# Patient Record
Sex: Female | Born: 1970 | Race: White | Hispanic: No | Marital: Married
Health system: Southern US, Community
[De-identification: ages and names within clinical notes are randomized; demographics above are authoritative.]

## PROBLEM LIST (undated history)

## (undated) HISTORY — PX: BREAST BIOPSY: SHX20

---

## 1998-02-19 ENCOUNTER — Ambulatory Visit (HOSPITAL_BASED_OUTPATIENT_CLINIC_OR_DEPARTMENT_OTHER): Admission: RE | Admit: 1998-02-19 | Discharge: 1998-02-19 | Payer: Self-pay | Admitting: Surgery

## 1999-04-07 ENCOUNTER — Encounter: Payer: Self-pay | Admitting: Obstetrics and Gynecology

## 1999-04-07 ENCOUNTER — Ambulatory Visit (HOSPITAL_COMMUNITY): Admission: RE | Admit: 1999-04-07 | Discharge: 1999-04-07 | Payer: Self-pay | Admitting: Obstetrics and Gynecology

## 1999-05-25 ENCOUNTER — Encounter: Payer: Self-pay | Admitting: Obstetrics and Gynecology

## 1999-05-25 ENCOUNTER — Ambulatory Visit (HOSPITAL_COMMUNITY): Admission: RE | Admit: 1999-05-25 | Discharge: 1999-05-25 | Payer: Self-pay | Admitting: Obstetrics and Gynecology

## 1999-06-27 ENCOUNTER — Inpatient Hospital Stay (HOSPITAL_COMMUNITY): Admission: AD | Admit: 1999-06-27 | Discharge: 1999-06-27 | Payer: Self-pay | Admitting: Obstetrics and Gynecology

## 1999-08-03 ENCOUNTER — Inpatient Hospital Stay (HOSPITAL_COMMUNITY): Admission: AD | Admit: 1999-08-03 | Discharge: 1999-08-05 | Payer: Self-pay | Admitting: Obstetrics and Gynecology

## 1999-08-06 ENCOUNTER — Encounter: Admission: RE | Admit: 1999-08-06 | Discharge: 1999-11-04 | Payer: Self-pay | Admitting: Obstetrics and Gynecology

## 1999-11-06 ENCOUNTER — Encounter: Admission: RE | Admit: 1999-11-06 | Discharge: 2000-02-04 | Payer: Self-pay | Admitting: Obstetrics and Gynecology

## 2001-03-06 ENCOUNTER — Other Ambulatory Visit: Admission: RE | Admit: 2001-03-06 | Discharge: 2001-03-06 | Payer: Self-pay | Admitting: Obstetrics and Gynecology

## 2001-09-30 ENCOUNTER — Ambulatory Visit (HOSPITAL_COMMUNITY): Admission: RE | Admit: 2001-09-30 | Discharge: 2001-09-30 | Payer: Self-pay | Admitting: Obstetrics and Gynecology

## 2001-09-30 ENCOUNTER — Encounter: Payer: Self-pay | Admitting: Obstetrics and Gynecology

## 2001-10-28 ENCOUNTER — Ambulatory Visit (HOSPITAL_COMMUNITY): Admission: RE | Admit: 2001-10-28 | Discharge: 2001-10-28 | Payer: Self-pay | Admitting: Obstetrics and Gynecology

## 2001-10-28 ENCOUNTER — Encounter: Payer: Self-pay | Admitting: Obstetrics and Gynecology

## 2001-11-11 ENCOUNTER — Inpatient Hospital Stay (HOSPITAL_COMMUNITY): Admission: AD | Admit: 2001-11-11 | Discharge: 2001-11-11 | Payer: Self-pay | Admitting: Obstetrics and Gynecology

## 2002-02-26 ENCOUNTER — Inpatient Hospital Stay (HOSPITAL_COMMUNITY): Admission: AD | Admit: 2002-02-26 | Discharge: 2002-02-28 | Payer: Self-pay | Admitting: Obstetrics and Gynecology

## 2002-03-01 ENCOUNTER — Encounter (HOSPITAL_COMMUNITY): Admission: RE | Admit: 2002-03-01 | Discharge: 2002-03-31 | Payer: Self-pay | Admitting: Obstetrics and Gynecology

## 2002-04-01 ENCOUNTER — Encounter: Admission: RE | Admit: 2002-04-01 | Discharge: 2002-05-01 | Payer: Self-pay | Admitting: Obstetrics and Gynecology

## 2002-04-14 ENCOUNTER — Other Ambulatory Visit: Admission: RE | Admit: 2002-04-14 | Discharge: 2002-04-14 | Payer: Self-pay | Admitting: Obstetrics and Gynecology

## 2002-05-02 ENCOUNTER — Encounter: Admission: RE | Admit: 2002-05-02 | Discharge: 2002-06-01 | Payer: Self-pay | Admitting: Obstetrics and Gynecology

## 2003-08-05 ENCOUNTER — Other Ambulatory Visit: Admission: RE | Admit: 2003-08-05 | Discharge: 2003-08-05 | Payer: Self-pay | Admitting: Obstetrics and Gynecology

## 2003-11-04 ENCOUNTER — Emergency Department (HOSPITAL_COMMUNITY): Admission: EM | Admit: 2003-11-04 | Discharge: 2003-11-04 | Payer: Self-pay | Admitting: Emergency Medicine

## 2004-09-22 ENCOUNTER — Other Ambulatory Visit: Admission: RE | Admit: 2004-09-22 | Discharge: 2004-09-22 | Payer: Self-pay | Admitting: Obstetrics and Gynecology

## 2005-02-19 IMAGING — CT CT HEAD W/O CM
1 of 2 series · 13 of 30 positions shown, 17 images · non-contrast
Comparison: none

CLINICAL DATA: 32-year-old fell.  Closed head injury.
NON-CONTRAST CRANIAL CT
Intracranially, the ventricles are in the midline without mass effect or shift.  No extraaxial fluid collections are seen.  No CT evidence for acute intracranial abnormality and no intracranial mass lesions.  There are acute calcified subcutaneous nodules located posteriorly.  The largest is posteriorly near the vertex and measures 12 mm.  Examination of the bony structures demonstrates no acute bony findings.  The visualized paranasal sinuses and mastoid air cells are clear. 
IMPRESSION
1.  No acute intracranial abnormality.
2.  No evidence for skull fracture. 
3.  Calcified subcutaneous nodules in the scalp posteriorly.  Recommend clinical correlation.

[Series 3: — · axial · 0.43mm/px · z∈[-142,-22]mm · 13 of 28 slices shown, 17 images]
[im 2/28  brain]
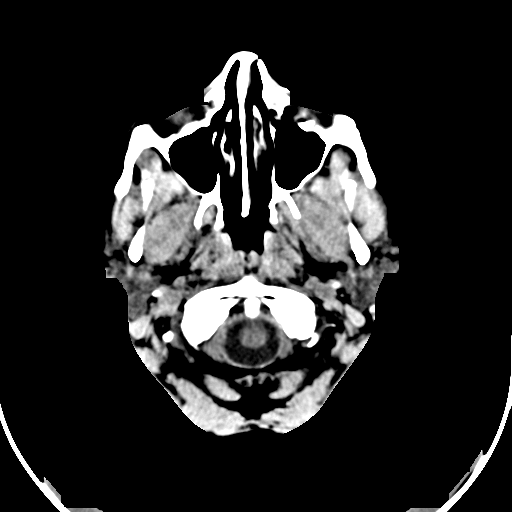
[im 2/28  bone]
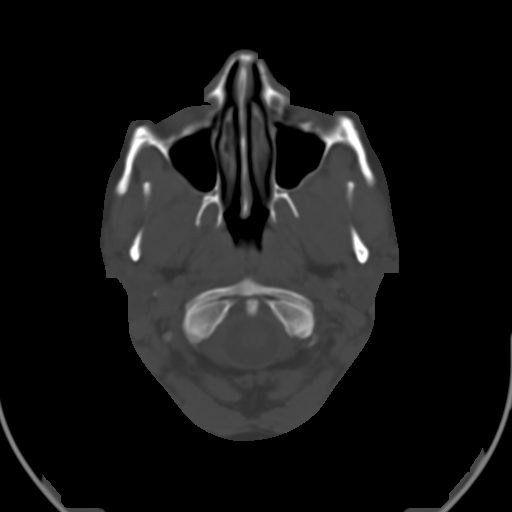
[im 4/28  brain]
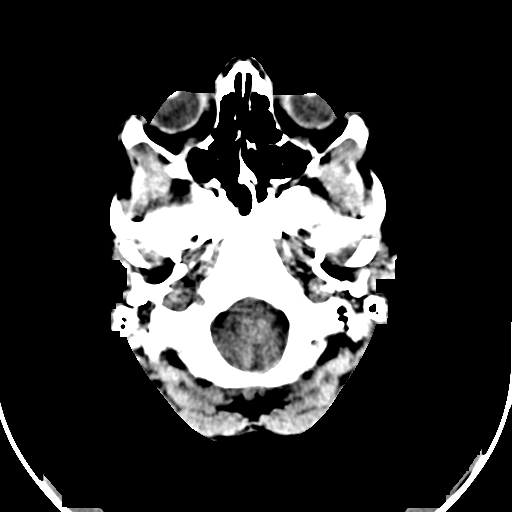
[im 6/28  brain]
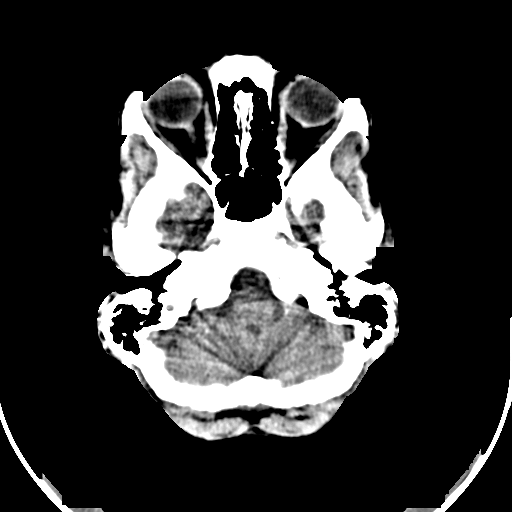
[im 8/28  brain]
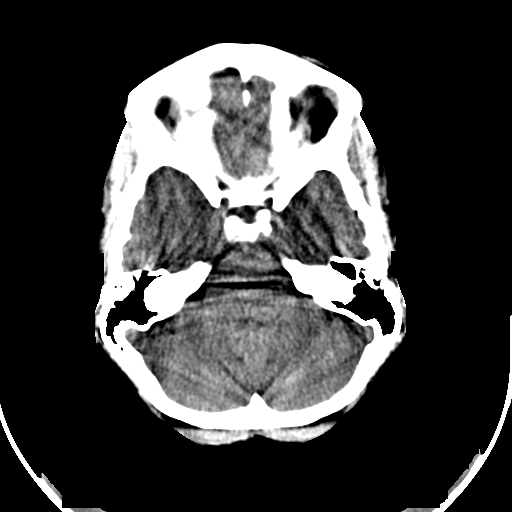
[im 10/28  brain]
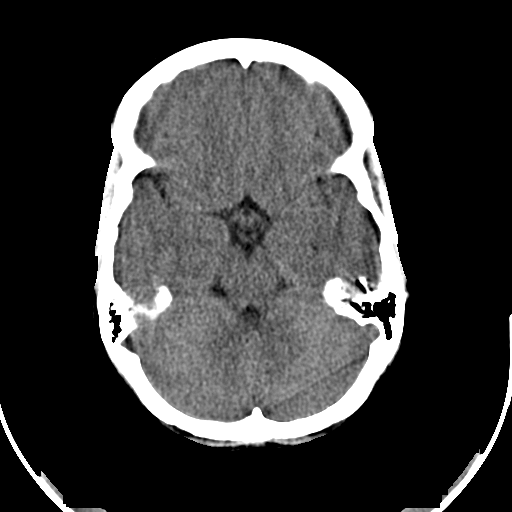
[im 10/28  bone]
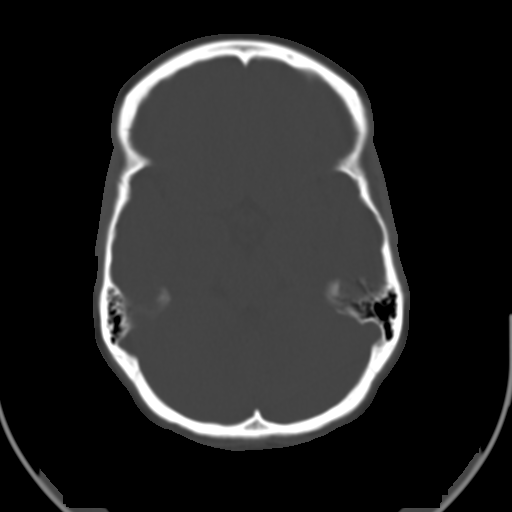
[im 12/28  brain]
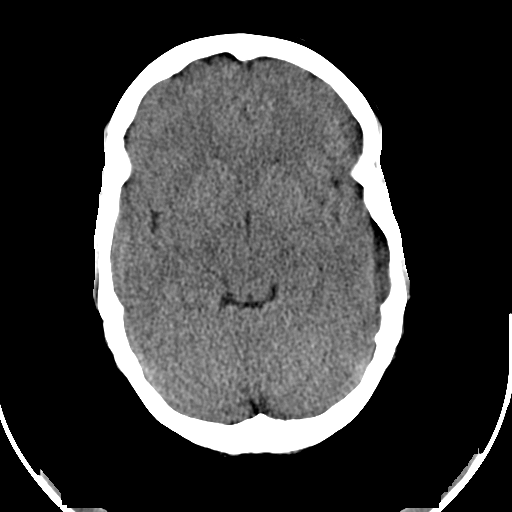
[im 14/28  brain]
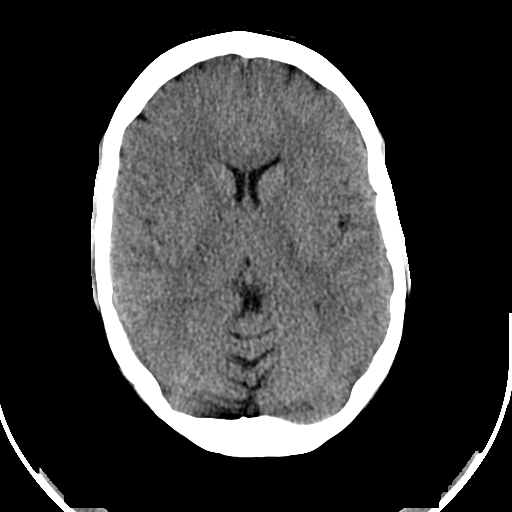
[im 16/28  brain]
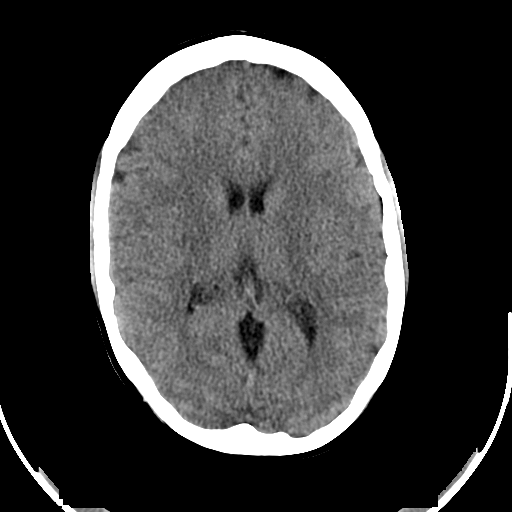
[im 18/28  brain]
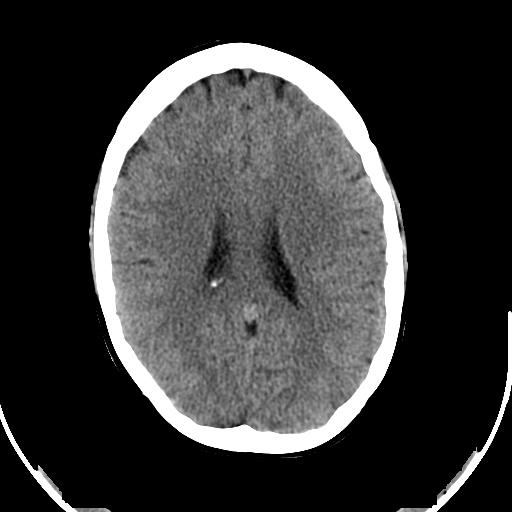
[im 18/28  bone]
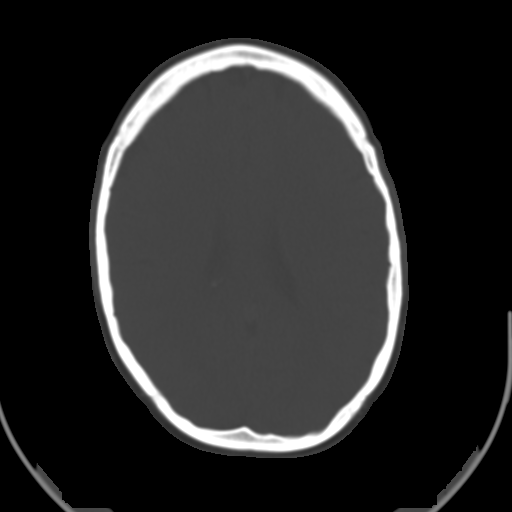
[im 20/28  brain]
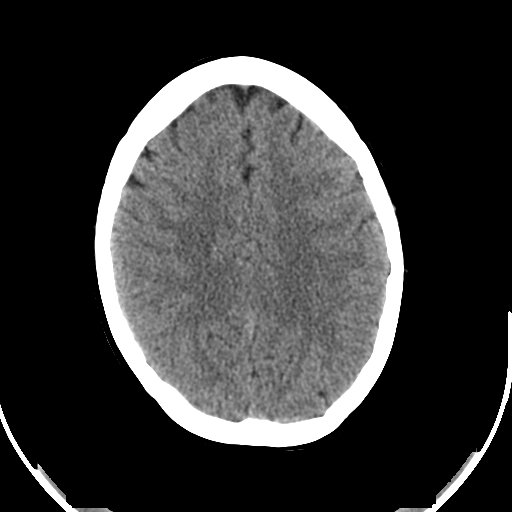
[im 22/28  brain]
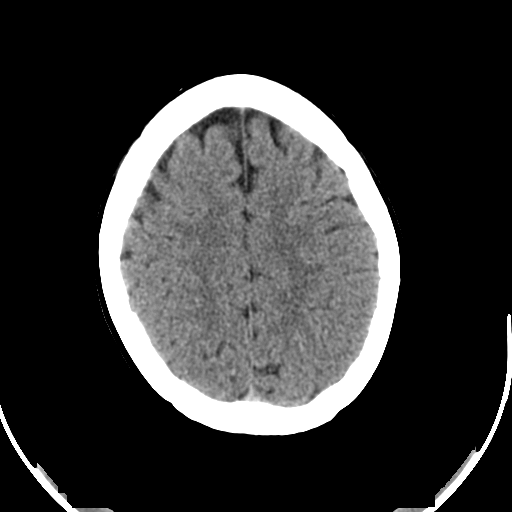
[im 24/28  brain]
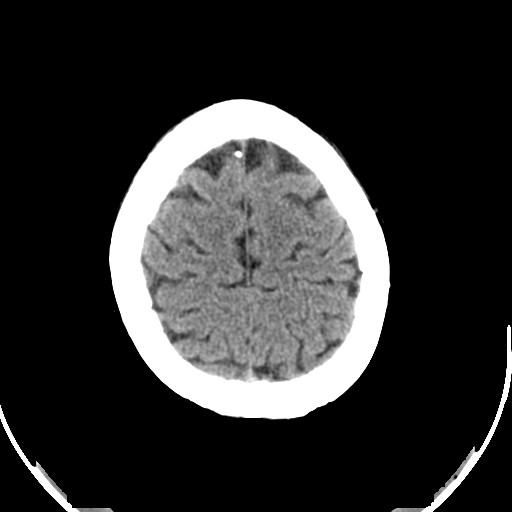
[im 26/28  brain]
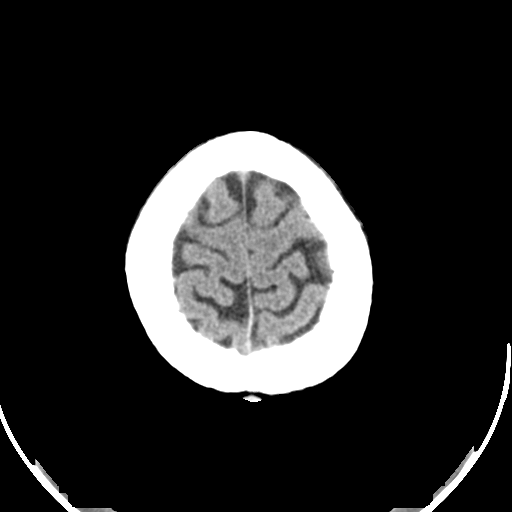
[im 26/28  bone]
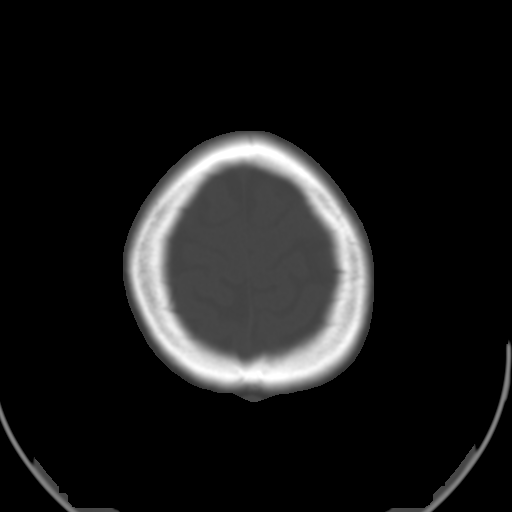

[13 of 30 positions shown; findings below may reference images not displayed]

## 2005-07-26 ENCOUNTER — Other Ambulatory Visit: Admission: RE | Admit: 2005-07-26 | Discharge: 2005-07-26 | Payer: Self-pay | Admitting: Obstetrics and Gynecology

## 2006-01-19 ENCOUNTER — Inpatient Hospital Stay (HOSPITAL_COMMUNITY): Admission: AD | Admit: 2006-01-19 | Discharge: 2006-01-21 | Payer: Self-pay | Admitting: Obstetrics and Gynecology

## 2010-11-02 ENCOUNTER — Encounter: Payer: Self-pay | Admitting: Family Medicine

## 2010-11-15 ENCOUNTER — Other Ambulatory Visit: Payer: Self-pay | Admitting: Family Medicine

## 2010-11-17 ENCOUNTER — Other Ambulatory Visit: Payer: Self-pay | Admitting: Family Medicine

## 2010-11-17 DIAGNOSIS — R928 Other abnormal and inconclusive findings on diagnostic imaging of breast: Secondary | ICD-10-CM

## 2010-11-28 ENCOUNTER — Ambulatory Visit
Admission: RE | Admit: 2010-11-28 | Discharge: 2010-11-28 | Disposition: A | Discharge status: Home / Self Care | Payer: Medicare HMO | Source: Ambulatory Visit | Attending: Family Medicine | Admitting: Family Medicine

## 2010-11-28 ENCOUNTER — Other Ambulatory Visit: Payer: Self-pay | Admitting: Family Medicine

## 2010-11-28 DIAGNOSIS — N6320 Unspecified lump in the left breast, unspecified quadrant: Secondary | ICD-10-CM

## 2010-11-28 DIAGNOSIS — R928 Other abnormal and inconclusive findings on diagnostic imaging of breast: Secondary | ICD-10-CM

## 2011-02-17 NOTE — H&P (Signed)
NAME:  Sara Acevedo, Sara Acevedo               ACCOUNT NO.:  [id number]   MEDICAL RECORD NO.:  [id number]          PATIENT TYPE:  INP   LOCATION:  9170                          FACILITY:  [facility]   PHYSICIAN:  Zenaida Niece, M.D.DATE OF BIRTH:  January 09, 1971   DATE OF ADMISSION:  01/19/2006  DATE OF DISCHARGE:                                HISTORY & PHYSICAL   CHIEF COMPLAINT:  Premature rupture of membranes.   HISTORY OF PRESENT ILLNESS:  This is a 40 year old white female, gravida 3,  para 1-1-0-2, with an EGA of 37+ weeks by a 7-week ultrasound with a due  date of May 5th, who is seen on the 20th for a routine exam. Dr. Ambrose Mantle saw  her and measured her size greater than dates. She had an NST which was  reactive and was scheduled to get an ultrasound. However, after her NST she  had spontaneous rupture of membranes. Dr. Ambrose Mantle had examined her prior to  that and her cervix was 2, 25, -3, and a vertex presentation. She is  admitted with premature rupture of membranes. Prenatal care complicated by  advanced maternal age with normal first trimester screening, an exposure to  parvovirus B19, without evidence of prior immunity. She was followed with  serial IgG and IgM levels and these never became positive. She had no  significant complications.   PRENATAL LABORATORIES:  Blood type is AB positive with negative antibody  screen, RPR nonreactive, rubella immune. Hepatitis B surface antigen  negative. Gonorrhea and Chlamydia negative. One-hour Glucola was 121 and  group B strep is negative.   OBSTETRIC HISTORY:  In 2000 she had a vaginal delivery at 35 weeks, baby  weighed 5 pounds 12 ounces. That pregnancy was complicated by preterm  premature rupture of membranes. In 2003 she had a vaginal delivery at 39  weeks, 7 pounds 13 ounces, without complications.   PAST MEDICAL HISTORY:  Negative.   PAST SURGICAL HISTORY:  She had right breast cystectomy.   ALLERGIES:  BIAXIN which causes vomiting.   CURRENT MEDICATIONS:  None.   FAMILY HISTORY:  Noncontributory.   PHYSICAL EXAMINATION:  VITAL SIGNS: Weight is 192 pounds, blood pressure  126/82.  GENERAL: This is a well-developed, gravid, female in no acute distress.  HEART: Regular rate and rhythm without murmur.  LUNGS: Clear to auscultation.  ABDOMEN: Gravid with a fundal height of 40.5 cm.  EXTREMITIES: 1+ edema.  PELVIC EXAM: Cervix again on Dr. Ebony Hail exam is 2, 25, -3 with a vertex  presentation.   ASSESSMENT:  Intrauterine pregnancy at 37+ weeks with premature rupture of  membranes and advanced maternal age.   PLAN:  Admit the patient to labor and delivery and monitor for contractions.  We will induce with pitocin if necessary.      Zenaida Niece, M.D.  Electronically Signed     TDM/MEDQ  D:  01/19/2006  T:  01/19/2006  Job:  [id number]

## 2011-02-17 NOTE — Discharge Summary (Signed)
NAME:  Sara Acevedo, Sara Acevedo               ACCOUNT NO.:  [id number]   MEDICAL RECORD NO.:  [id number]          PATIENT TYPE:  INP   LOCATION:  9111                          FACILITY:  [facility]   PHYSICIAN:  Zenaida Niece, M.D.DATE OF BIRTH:  1971-02-10   DATE OF ADMISSION:  01/19/2006  DATE OF DISCHARGE:  01/21/2006                                 DISCHARGE SUMMARY   ADMISSION DIAGNOSIS:  Intrauterine pregnancy at 37 weeks, with premature  rupture of membranes.   DISCHARGE DIAGNOSIS:  Intrauterine pregnancy at 37 weeks, with premature  rupture of membranes.   PROCEDURE:  On January 19, 2006, she had a spontaneous vaginal delivery.   Please see chart for full history and physical, but briefly this is a 40-  year-old white female gravida 3, para 2-0-0-2, with an EGA of 37+ weeks, who  while being evaluated at the office for size less than dates had spontaneous  rupture of membranes. She was found to have ruptured membranes in the  office, and cervix was 1 cm dilated.   HOSPITAL COURSE:  The patient was admitted and continued to contract on her  own. She progressed in active labor and received an epidural. She progressed  to complete, pushed well, and on the evening of January 19, 2006 had a vaginal  delivery of a viable female infant with Apgars of 8 and 9 who weight 8  pounds. Placenta delivered spontaneous, was intact. She had a right  periclitoral/periurethral laceration repaired with 3-0 Vicryl with local  block, and estimated blood loss was less than 500 cc. Postpartum, she had no  significant complications, and on postpartum 2 she was felt be stable enough  for discharge home.   DISCHARGE INSTRUCTIONS:  Regular diet.  Pelvic rest.  Follow up in 6 weeks.   MEDICATIONS:  1.  Percocet, #20, 1-2 p.o. q. 4-6 hours p.r.n. pain.  2.  Over-the-counter ibuprofen as needed.   She is given our discharge pamphlet.      Zenaida Niece, M.D.  Electronically Signed     TDM/MEDQ  D:   01/21/2006  T:  01/23/2006  Job:  [id number]

## 2011-02-17 NOTE — Discharge Summary (Signed)
[facility]  Patient:    Sara Acevedo, Sara Acevedo Visit Number: [id number] MRN: [id number]          Service Type: OBS Location: 910A [id number] [id number] Attending Physician:  Oliver Pila Dictated by:   Alvino Chapel, M.D. Admit Date:  02/26/2002 Discharge Date: 02/28/2002                             Discharge Summary  DISCHARGE DIAGNOSES:          1. Term pregnancy at 39 weeks, delivered.                               2. Status post normal spontaneous vaginal                                  delivery.  DISCHARGE MEDICATIONS:        1. Motrin 600 mg p.o. q.6h.                               2. Percocet 1-2 tablets p.o. q.4h. p.r.n.  DISCHARGE FOLLOWUP:           The patient is to follow up in 6 weeks for her routine postpartum exam.  HOSPITAL COURSE:              The patient is a 40 year old, G2, P0, 1-0-1 who was admitted at 39+ weeks for induction given term, and a favorable cervix. Pregnancy had been complicated by a history of problems and preterm delivery at 35 weeks last pregnancy.  Also she had a positive GBS status that pregnancy; however, this pregnancy tested negative.  Prenatal labs were as follows:  AB positive, antibody negative, RPR nonreactive, Rubella immune, hepatitis B surface antigen negative, HIV negative, GC negative. chlamydia negative, GBS negative, triple screen normal and 1 hour glucola 88.  PAST OBSTETRICAL HISTORY:     In 2000 at 35 weeks she had normal spontaneous vaginal delivery.  PAST SURGICAL HISTORY:        In 1999 she had a breast lumpectomy.  PAST GYNECOLOGICAL HISTORY:   No abnormal Pap smears.  PAST MEDICAL HISTORY:         None.  ALLERGIES:                    BIAXIN.  HOSPITAL COURSE:              On admission she was afebrile with stable vital signs.  Fetal heart rate was reactive.  She had rare contractions.  Cervix was 50%, 2-3 at a -1 station.  Estimated fetal weight was 7-1/2 to 8 pounds. She had  AROM of clear fluid and was begun on Pitocin.  The patient progressed very quickly and within several hours reached complete dilation and pushed well with a normal spontaneous vaginal delivery of a vigorous female infant over a first degree perineal laceration.  Apgars were 8 and 9 weight was 7 pounds 13 ounces.  There was a compound presentation of the left arm and the cord was around it one time.  The placenta delivered spontaneously, first degree laceration of the perineum was repaired with 2-0 Vicryl.  A left labial laceration was reapproximated with 2-0 Vicryl and interrupted sutures.  The  cervix and rectum were intact.  EBL was 350 cc.  The patient did very well postpartum and on postpartum day #2 was afebrile with stable vital signs and bleeding was normal.  Therefore, the patient was felt stable for discharge home and was discharged to follow up in 6 weeks. Dictated by:   Alvino Chapel, M.D. Attending Physician:  Oliver Pila DD:  02/28/02 TD:  03/03/02 Job: [id number]

## 2011-11-13 ENCOUNTER — Other Ambulatory Visit: Payer: Self-pay | Admitting: Family Medicine

## 2011-11-13 DIAGNOSIS — Z1231 Encounter for screening mammogram for malignant neoplasm of breast: Secondary | ICD-10-CM

## 2011-11-30 ENCOUNTER — Ambulatory Visit: Payer: Medicare HMO

## 2011-12-06 ENCOUNTER — Ambulatory Visit
Admission: RE | Admit: 2011-12-06 | Discharge: 2011-12-06 | Disposition: A | Discharge status: Home / Self Care | Payer: Managed Care, Other (non HMO) | Source: Ambulatory Visit | Attending: Family Medicine | Admitting: Family Medicine

## 2011-12-06 DIAGNOSIS — Z1231 Encounter for screening mammogram for malignant neoplasm of breast: Secondary | ICD-10-CM

## 2012-12-23 ENCOUNTER — Other Ambulatory Visit: Payer: Self-pay

## 2012-12-23 DIAGNOSIS — Z1231 Encounter for screening mammogram for malignant neoplasm of breast: Secondary | ICD-10-CM

## 2013-01-20 ENCOUNTER — Ambulatory Visit: Payer: Managed Care, Other (non HMO)

## 2013-02-04 ENCOUNTER — Ambulatory Visit
Admission: RE | Admit: 2013-02-04 | Discharge: 2013-02-04 | Disposition: A | Discharge status: Home / Self Care | Payer: 59 | Source: Ambulatory Visit

## 2013-02-04 DIAGNOSIS — Z1231 Encounter for screening mammogram for malignant neoplasm of breast: Secondary | ICD-10-CM

## 2013-12-08 ENCOUNTER — Other Ambulatory Visit: Payer: Self-pay

## 2013-12-08 DIAGNOSIS — Z1231 Encounter for screening mammogram for malignant neoplasm of breast: Secondary | ICD-10-CM

## 2014-01-21 ENCOUNTER — Other Ambulatory Visit: Payer: Self-pay

## 2014-01-21 DIAGNOSIS — Z1231 Encounter for screening mammogram for malignant neoplasm of breast: Secondary | ICD-10-CM

## 2014-01-30 ENCOUNTER — Other Ambulatory Visit: Payer: Self-pay | Admitting: Obstetrics and Gynecology

## 2014-01-30 DIAGNOSIS — R928 Other abnormal and inconclusive findings on diagnostic imaging of breast: Secondary | ICD-10-CM

## 2014-02-05 ENCOUNTER — Ambulatory Visit: Payer: 59

## 2014-02-06 ENCOUNTER — Ambulatory Visit
Admission: RE | Admit: 2014-02-06 | Discharge: 2014-02-06 | Disposition: A | Discharge status: Home / Self Care | Payer: 59 | Source: Ambulatory Visit | Attending: Obstetrics and Gynecology | Admitting: Obstetrics and Gynecology

## 2014-02-06 ENCOUNTER — Other Ambulatory Visit: Payer: 59

## 2014-02-06 DIAGNOSIS — R928 Other abnormal and inconclusive findings on diagnostic imaging of breast: Secondary | ICD-10-CM

## 2014-02-18 ENCOUNTER — Other Ambulatory Visit: Payer: Self-pay | Admitting: Obstetrics and Gynecology

## 2014-02-18 DIAGNOSIS — N6019 Diffuse cystic mastopathy of unspecified breast: Secondary | ICD-10-CM

## 2014-02-26 ENCOUNTER — Ambulatory Visit
Admission: RE | Admit: 2014-02-26 | Discharge: 2014-02-26 | Disposition: A | Discharge status: Home / Self Care | Payer: 59 | Source: Ambulatory Visit | Attending: Obstetrics and Gynecology | Admitting: Obstetrics and Gynecology

## 2014-02-26 ENCOUNTER — Other Ambulatory Visit: Payer: Self-pay | Admitting: Obstetrics and Gynecology

## 2014-02-26 DIAGNOSIS — N6019 Diffuse cystic mastopathy of unspecified breast: Secondary | ICD-10-CM

## 2014-10-29 ENCOUNTER — Other Ambulatory Visit: Payer: Self-pay | Admitting: Physician Assistant

## 2015-04-01 ENCOUNTER — Other Ambulatory Visit: Payer: Self-pay

## 2015-04-01 DIAGNOSIS — Z1231 Encounter for screening mammogram for malignant neoplasm of breast: Secondary | ICD-10-CM

## 2015-04-02 ENCOUNTER — Ambulatory Visit
Admission: RE | Admit: 2015-04-02 | Discharge: 2015-04-02 | Disposition: A | Discharge status: Home / Self Care | Payer: 59 | Source: Ambulatory Visit

## 2015-04-02 ENCOUNTER — Encounter (INDEPENDENT_AMBULATORY_CARE_PROVIDER_SITE_OTHER): Payer: Self-pay

## 2015-04-02 DIAGNOSIS — Z1231 Encounter for screening mammogram for malignant neoplasm of breast: Secondary | ICD-10-CM

## 2015-12-08 ENCOUNTER — Other Ambulatory Visit: Payer: Self-pay

## 2015-12-08 DIAGNOSIS — Z1231 Encounter for screening mammogram for malignant neoplasm of breast: Secondary | ICD-10-CM

## 2016-04-05 ENCOUNTER — Other Ambulatory Visit: Payer: Self-pay | Admitting: Obstetrics and Gynecology

## 2016-04-05 DIAGNOSIS — Z1231 Encounter for screening mammogram for malignant neoplasm of breast: Secondary | ICD-10-CM

## 2016-04-17 ENCOUNTER — Ambulatory Visit: Payer: 59

## 2016-05-15 ENCOUNTER — Ambulatory Visit
Admission: RE | Admit: 2016-05-15 | Discharge: 2016-05-15 | Disposition: A | Discharge status: Home / Self Care | Payer: 59 | Source: Ambulatory Visit | Attending: Obstetrics and Gynecology | Admitting: Obstetrics and Gynecology

## 2016-05-15 DIAGNOSIS — Z1231 Encounter for screening mammogram for malignant neoplasm of breast: Secondary | ICD-10-CM

## 2017-03-13 ENCOUNTER — Other Ambulatory Visit: Payer: Self-pay | Admitting: Obstetrics and Gynecology

## 2017-03-13 DIAGNOSIS — Z1231 Encounter for screening mammogram for malignant neoplasm of breast: Secondary | ICD-10-CM

## 2017-05-28 ENCOUNTER — Ambulatory Visit: Payer: 59

## 2017-08-17 ENCOUNTER — Ambulatory Visit
Admission: RE | Admit: 2017-08-17 | Discharge: 2017-08-17 | Disposition: A | Discharge status: Home / Self Care | Payer: 59 | Source: Ambulatory Visit | Attending: Obstetrics and Gynecology | Admitting: Obstetrics and Gynecology

## 2017-08-17 DIAGNOSIS — Z1231 Encounter for screening mammogram for malignant neoplasm of breast: Secondary | ICD-10-CM

## 2018-04-17 ENCOUNTER — Other Ambulatory Visit: Payer: Self-pay | Admitting: Obstetrics and Gynecology

## 2018-04-17 DIAGNOSIS — Z1231 Encounter for screening mammogram for malignant neoplasm of breast: Secondary | ICD-10-CM
